# Patient Record
Sex: Female | Born: 1960 | Race: White | Hispanic: No | Marital: Married
Health system: Southern US, Community
[De-identification: ages and names within clinical notes are randomized; demographics above are authoritative.]

---

## 2001-01-23 ENCOUNTER — Ambulatory Visit (HOSPITAL_COMMUNITY): Admission: RE | Admit: 2001-01-23 | Discharge: 2001-01-23 | Payer: Self-pay | Admitting: Orthopedic Surgery

## 2004-09-08 ENCOUNTER — Encounter: Admission: RE | Admit: 2004-09-08 | Discharge: 2004-09-08 | Payer: Self-pay | Admitting: Family Medicine

## 2008-03-03 ENCOUNTER — Ambulatory Visit: Payer: Self-pay | Admitting: Family Medicine

## 2008-04-16 ENCOUNTER — Ambulatory Visit: Payer: Self-pay | Admitting: Family Medicine

## 2014-12-24 ENCOUNTER — Other Ambulatory Visit: Payer: Self-pay

## 2014-12-24 DIAGNOSIS — Z1231 Encounter for screening mammogram for malignant neoplasm of breast: Secondary | ICD-10-CM

## 2015-01-26 ENCOUNTER — Ambulatory Visit
Admission: RE | Admit: 2015-01-26 | Discharge: 2015-01-26 | Disposition: A | Discharge status: Home / Self Care | Payer: BC Managed Care – PPO | Source: Ambulatory Visit

## 2015-01-26 DIAGNOSIS — Z1231 Encounter for screening mammogram for malignant neoplasm of breast: Secondary | ICD-10-CM

## 2015-12-25 ENCOUNTER — Other Ambulatory Visit: Payer: Self-pay | Admitting: Family Medicine

## 2015-12-25 DIAGNOSIS — Z1231 Encounter for screening mammogram for malignant neoplasm of breast: Secondary | ICD-10-CM

## 2016-01-28 ENCOUNTER — Ambulatory Visit: Payer: BC Managed Care – PPO

## 2016-02-25 ENCOUNTER — Ambulatory Visit
Admission: RE | Admit: 2016-02-25 | Discharge: 2016-02-25 | Disposition: A | Discharge status: Home / Self Care | Payer: BC Managed Care – PPO | Source: Ambulatory Visit | Attending: Family Medicine | Admitting: Family Medicine

## 2016-02-25 DIAGNOSIS — Z1231 Encounter for screening mammogram for malignant neoplasm of breast: Secondary | ICD-10-CM

## 2016-02-29 ENCOUNTER — Other Ambulatory Visit: Payer: Self-pay | Admitting: Family Medicine

## 2016-02-29 DIAGNOSIS — R928 Other abnormal and inconclusive findings on diagnostic imaging of breast: Secondary | ICD-10-CM

## 2016-03-07 ENCOUNTER — Ambulatory Visit
Admission: RE | Admit: 2016-03-07 | Discharge: 2016-03-07 | Disposition: A | Discharge status: Home / Self Care | Payer: BC Managed Care – PPO | Source: Ambulatory Visit | Attending: Family Medicine | Admitting: Family Medicine

## 2016-03-07 DIAGNOSIS — R928 Other abnormal and inconclusive findings on diagnostic imaging of breast: Secondary | ICD-10-CM

## 2016-11-08 ENCOUNTER — Other Ambulatory Visit: Payer: Self-pay | Admitting: Family Medicine

## 2016-11-08 DIAGNOSIS — N63 Unspecified lump in unspecified breast: Secondary | ICD-10-CM

## 2016-11-23 ENCOUNTER — Other Ambulatory Visit: Payer: BC Managed Care – PPO

## 2016-12-01 ENCOUNTER — Ambulatory Visit
Admission: RE | Admit: 2016-12-01 | Discharge: 2016-12-01 | Disposition: A | Discharge status: Home / Self Care | Payer: BC Managed Care – PPO | Source: Ambulatory Visit | Attending: Family Medicine | Admitting: Family Medicine

## 2016-12-01 DIAGNOSIS — N63 Unspecified lump in unspecified breast: Secondary | ICD-10-CM

## 2017-01-16 ENCOUNTER — Other Ambulatory Visit: Payer: Self-pay | Admitting: Family Medicine

## 2017-01-16 DIAGNOSIS — Z1231 Encounter for screening mammogram for malignant neoplasm of breast: Secondary | ICD-10-CM

## 2017-03-02 ENCOUNTER — Ambulatory Visit
Admission: RE | Admit: 2017-03-02 | Discharge: 2017-03-02 | Disposition: A | Discharge status: Home / Self Care | Payer: BC Managed Care – PPO | Source: Ambulatory Visit | Attending: Family Medicine | Admitting: Family Medicine

## 2017-03-02 DIAGNOSIS — Z1231 Encounter for screening mammogram for malignant neoplasm of breast: Secondary | ICD-10-CM

## 2018-01-19 ENCOUNTER — Other Ambulatory Visit: Payer: Self-pay | Admitting: Family Medicine

## 2018-01-19 DIAGNOSIS — Z1231 Encounter for screening mammogram for malignant neoplasm of breast: Secondary | ICD-10-CM

## 2018-03-05 ENCOUNTER — Ambulatory Visit
Admission: RE | Admit: 2018-03-05 | Discharge: 2018-03-05 | Disposition: A | Discharge status: Home / Self Care | Payer: BC Managed Care – PPO | Source: Ambulatory Visit | Attending: Family Medicine | Admitting: Family Medicine

## 2018-03-05 DIAGNOSIS — Z1231 Encounter for screening mammogram for malignant neoplasm of breast: Secondary | ICD-10-CM

## 2019-02-25 ENCOUNTER — Other Ambulatory Visit: Payer: Self-pay | Admitting: Family Medicine

## 2019-02-25 DIAGNOSIS — Z1231 Encounter for screening mammogram for malignant neoplasm of breast: Secondary | ICD-10-CM

## 2019-03-19 ENCOUNTER — Other Ambulatory Visit: Payer: Self-pay

## 2019-03-19 ENCOUNTER — Ambulatory Visit
Admission: RE | Admit: 2019-03-19 | Discharge: 2019-03-19 | Disposition: A | Discharge status: Home / Self Care | Payer: BC Managed Care – PPO | Source: Ambulatory Visit | Attending: Family Medicine | Admitting: Family Medicine

## 2019-03-19 DIAGNOSIS — Z1231 Encounter for screening mammogram for malignant neoplasm of breast: Secondary | ICD-10-CM

## 2019-12-26 ENCOUNTER — Ambulatory Visit: Payer: BC Managed Care – PPO | Attending: Internal Medicine

## 2019-12-26 DIAGNOSIS — Z23 Encounter for immunization: Secondary | ICD-10-CM

## 2019-12-26 NOTE — Progress Notes (Signed)
   Covid-[facility] Vaccination [facility]  Name:  Emmory Solivan    MRN: [id number] DOB: 1961/03/04  12/26/2019  Ms. Lapinsky was observed post Covid-19 immunization for 15 minutes without incident. She was provided with Vaccine Information Sheet and instruction to access the V-Safe system.   Ms. Bloch was instructed to call 911 with any severe reactions post vaccine: Marland Kitchen Difficulty breathing  . Swelling of face and throat  . A fast heartbeat  . A bad rash all over body  . Dizziness and weakness

## 2020-02-11 ENCOUNTER — Other Ambulatory Visit: Payer: Self-pay | Admitting: Family Medicine

## 2020-02-11 DIAGNOSIS — Z1231 Encounter for screening mammogram for malignant neoplasm of breast: Secondary | ICD-10-CM

## 2020-03-23 ENCOUNTER — Ambulatory Visit
Admission: RE | Admit: 2020-03-23 | Discharge: 2020-03-23 | Disposition: A | Discharge status: Home / Self Care | Payer: BC Managed Care – PPO | Source: Ambulatory Visit | Attending: Family Medicine | Admitting: Family Medicine

## 2020-03-23 ENCOUNTER — Other Ambulatory Visit: Payer: Self-pay

## 2020-03-23 DIAGNOSIS — Z1231 Encounter for screening mammogram for malignant neoplasm of breast: Secondary | ICD-10-CM

## 2020-03-25 ENCOUNTER — Ambulatory Visit: Payer: BC Managed Care – PPO

## 2020-07-01 ENCOUNTER — Ambulatory Visit: Payer: Self-pay | Attending: Internal Medicine

## 2020-07-01 ENCOUNTER — Other Ambulatory Visit: Payer: Self-pay

## 2020-07-01 DIAGNOSIS — Z23 Encounter for immunization: Secondary | ICD-10-CM

## 2020-07-01 NOTE — Progress Notes (Signed)
   Covid-19 Vaccination Clinic  Name:  Leah Wilcox    MRN: [id number] DOB: 03/09/1961  07/01/2020  Leah Wilcox was observed post Covid-19 immunization for 15 minutes without incident. She was provided with Vaccine Information Sheet and instruction to access the V-Safe system.   Leah Wilcox was instructed to call 911 with any severe reactions post vaccine: Marland Kitchen Difficulty breathing  . Swelling of face and throat  . A fast heartbeat  . A bad rash all over body  . Dizziness and weakness   Immunizations Administered    Name Date Dose VIS Date Route   PFIZER Comrnaty(Gray TOP) Covid-19 Vaccine 07/01/2020 12:20 PM 0.3 mL 03/05/2020 Intramuscular   Manufacturer: [employer], [employer]   Lot: [id number]   NDC: [phone]

## 2020-07-09 ENCOUNTER — Other Ambulatory Visit (HOSPITAL_BASED_OUTPATIENT_CLINIC_OR_DEPARTMENT_OTHER): Payer: Self-pay

## 2020-07-09 MED ORDER — COVID-19 MRNA VACCINE (PFIZER) 30 MCG/0.3ML IM SUSP
INTRAMUSCULAR | 0 refills | Status: AC
  Filled 2020-07-09: qty 0.3, 1d supply, fill #0

## 2020-12-02 ENCOUNTER — Ambulatory Visit: Payer: Self-pay

## 2021-02-22 ENCOUNTER — Other Ambulatory Visit: Payer: Self-pay | Admitting: Family Medicine

## 2021-02-22 DIAGNOSIS — Z1231 Encounter for screening mammogram for malignant neoplasm of breast: Secondary | ICD-10-CM

## 2021-03-26 ENCOUNTER — Ambulatory Visit
Admission: RE | Admit: 2021-03-26 | Discharge: 2021-03-26 | Disposition: A | Discharge status: Home / Self Care | Payer: BC Managed Care – PPO | Source: Ambulatory Visit | Attending: Family Medicine | Admitting: Family Medicine

## 2021-03-26 DIAGNOSIS — Z1231 Encounter for screening mammogram for malignant neoplasm of breast: Secondary | ICD-10-CM

## 2022-02-10 ENCOUNTER — Other Ambulatory Visit: Payer: Self-pay | Admitting: Family Medicine

## 2022-02-10 DIAGNOSIS — Z1231 Encounter for screening mammogram for malignant neoplasm of breast: Secondary | ICD-10-CM

## 2022-04-18 ENCOUNTER — Ambulatory Visit
Admission: RE | Admit: 2022-04-18 | Discharge: 2022-04-18 | Disposition: A | Discharge status: Home / Self Care | Payer: BC Managed Care – PPO | Source: Ambulatory Visit | Attending: Family Medicine | Admitting: Family Medicine

## 2022-04-18 DIAGNOSIS — Z1231 Encounter for screening mammogram for malignant neoplasm of breast: Secondary | ICD-10-CM

## 2022-04-26 IMAGING — MG MM DIGITAL SCREENING BILAT W/ TOMO AND CAD
8 series · 8 of 24 positions shown · non-contrast
Comparison: Previous exam(s).

CLINICAL DATA: Screening.

EXAM:
DIGITAL SCREENING BILATERAL MAMMOGRAM WITH TOMOSYNTHESIS AND CAD
TECHNIQUE: Bilateral screening digital craniocaudal and mediolateral oblique
mammograms were obtained. Bilateral screening digital breast
tomosynthesis was performed. The images were evaluated with
computer-aided detection.

[R MLO synth-2D]
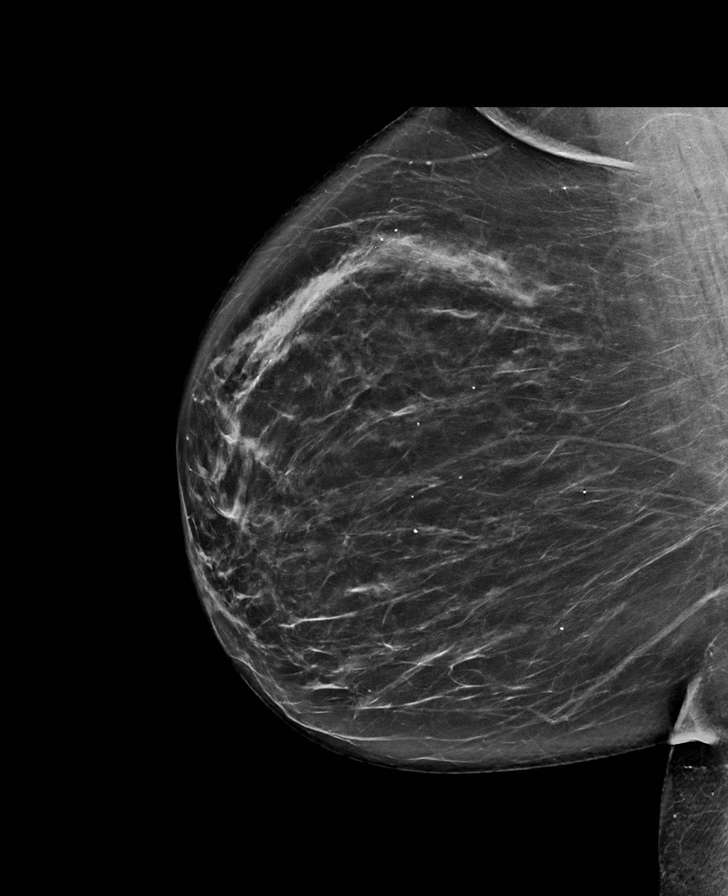

[L CC synth-2D]
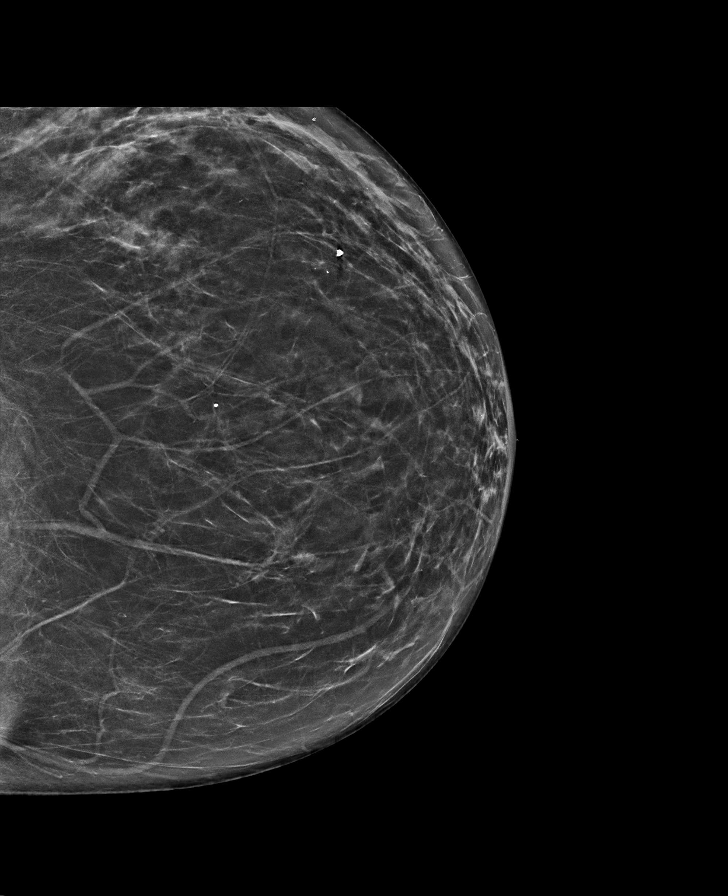

[L MLO synth-2D]
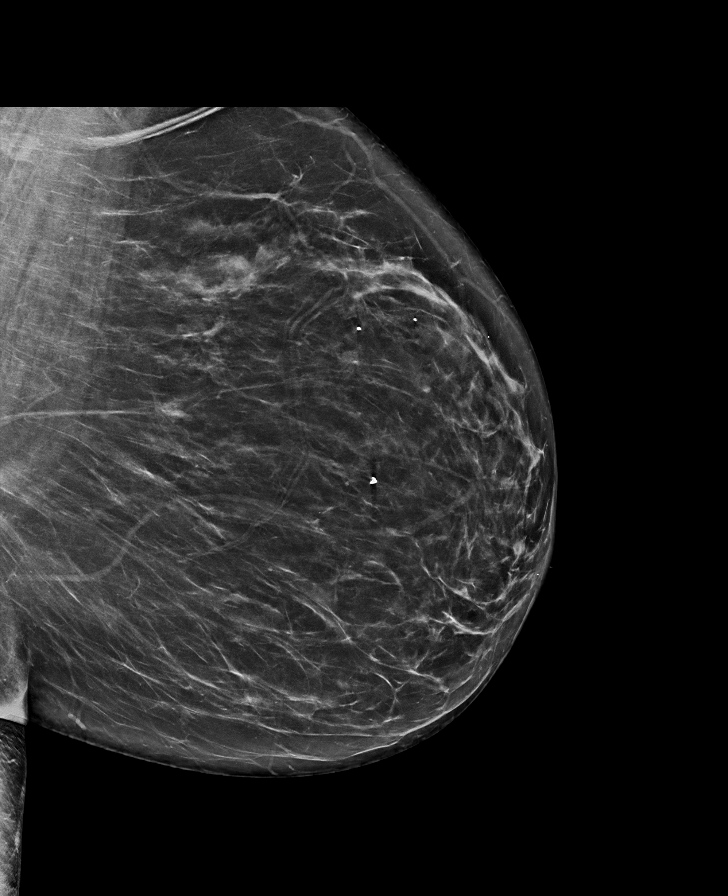

[R CC synth-2D]
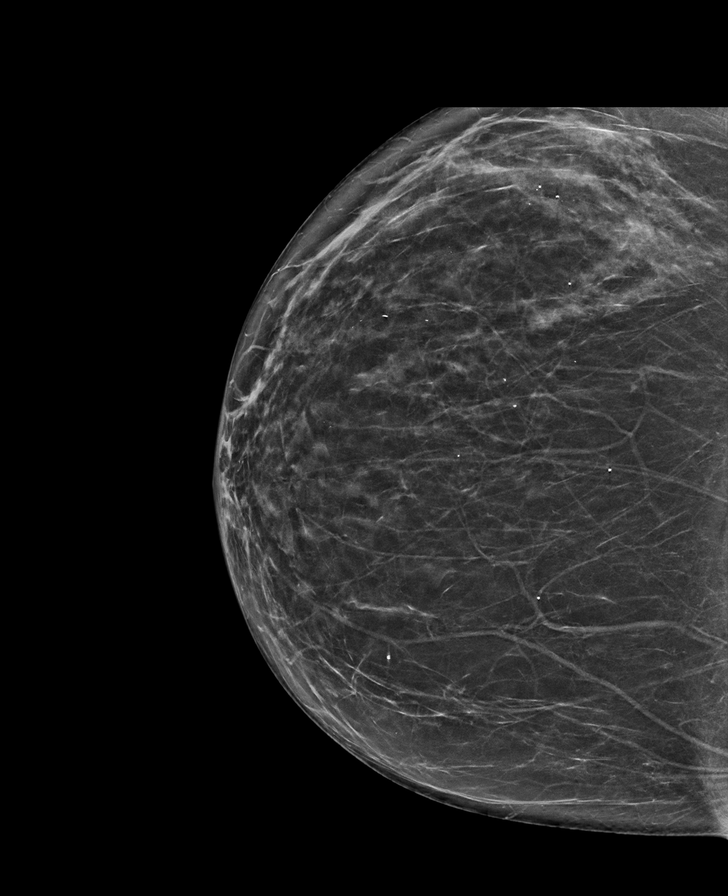

[R MLO tomo · tomo slice 44/87.0]
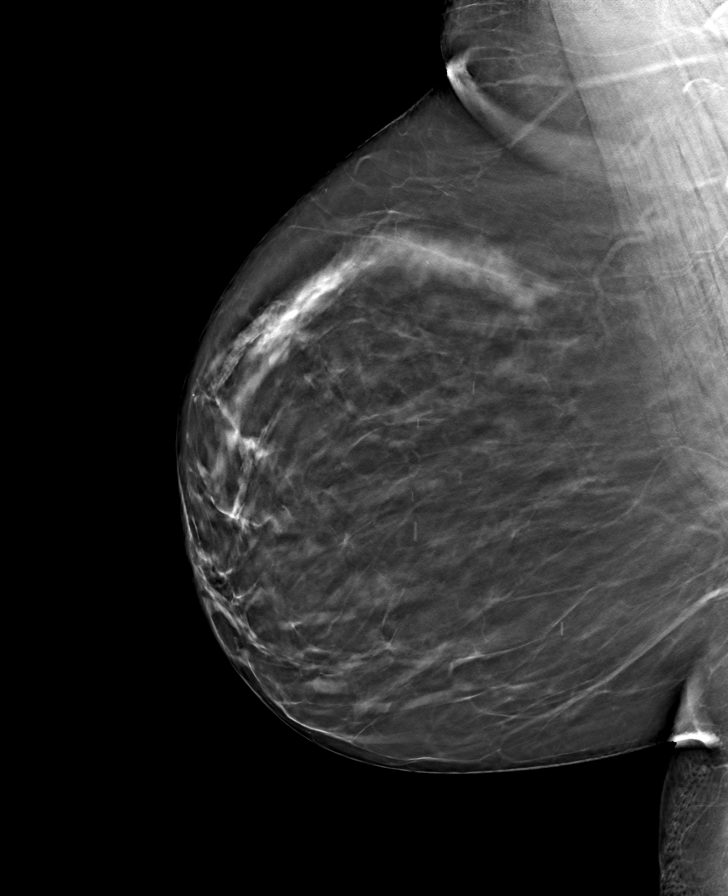

[R CC tomo · tomo slice 36/71.0]
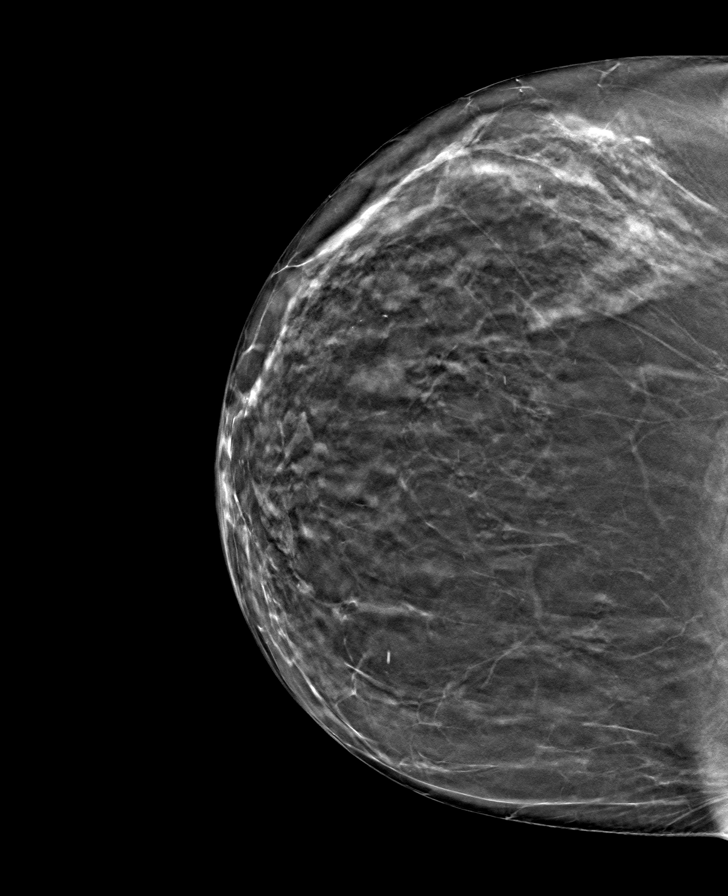

[L MLO tomo · tomo slice 41/80.0]
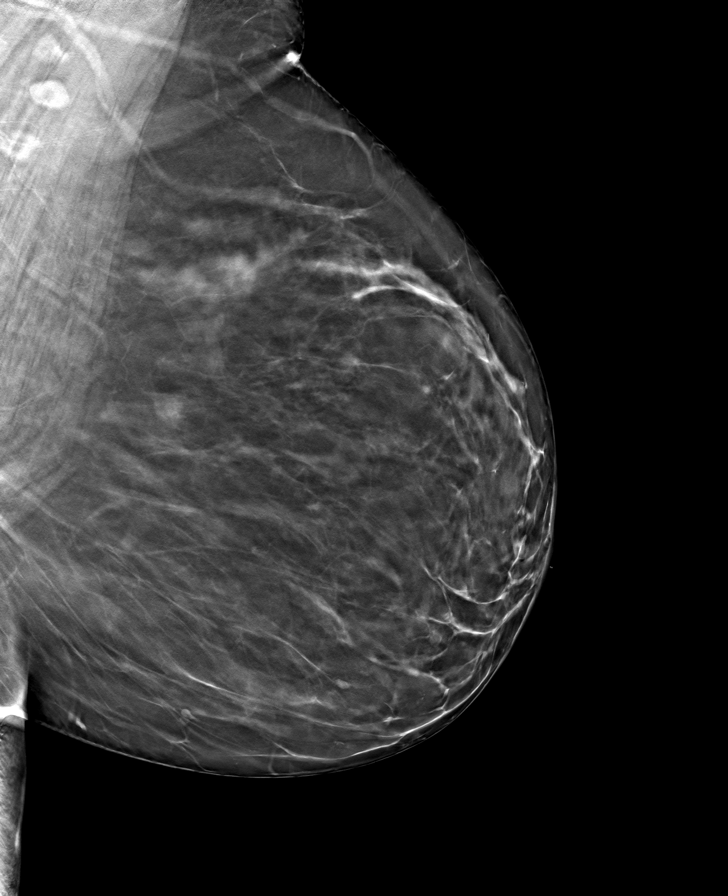

[L CC tomo · tomo slice 35/69.0]
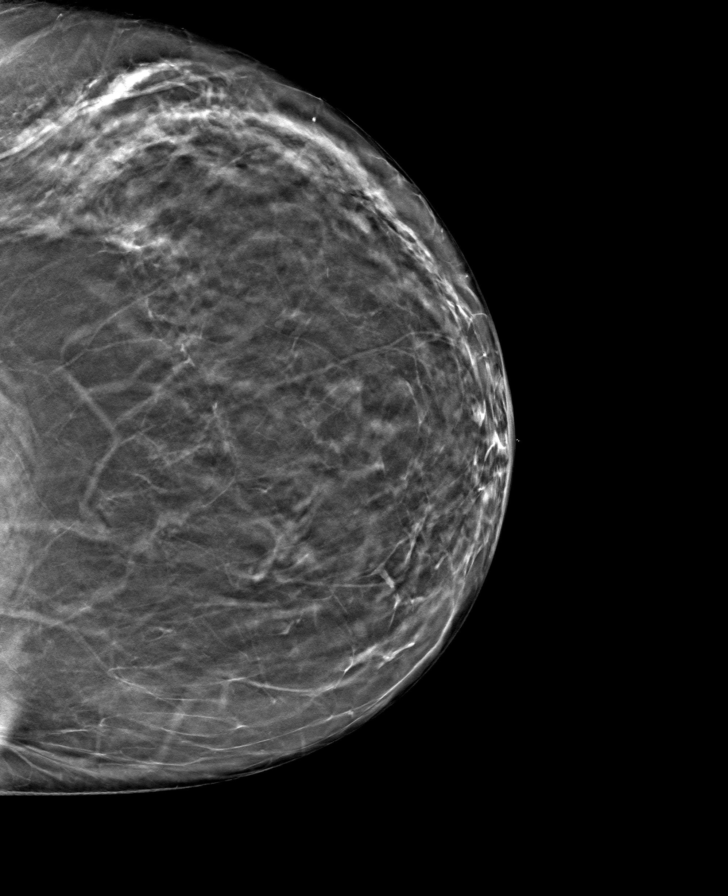

[8 of 24 positions shown; findings below may reference images not displayed]

ACR Breast Density Category b: There are scattered areas of
fibroglandular density.
FINDINGS: There are no findings suspicious for malignancy.
IMPRESSION: No mammographic evidence of malignancy. A result letter of this
screening mammogram will be mailed directly to the patient.

RECOMMENDATION:
Screening mammogram in one year. (Code:51-O-LD2)

BI-RADS CATEGORY  1: Negative.

## 2023-03-07 ENCOUNTER — Other Ambulatory Visit: Payer: Self-pay | Admitting: Family Medicine

## 2023-03-07 DIAGNOSIS — Z1231 Encounter for screening mammogram for malignant neoplasm of breast: Secondary | ICD-10-CM

## 2023-04-24 ENCOUNTER — Ambulatory Visit
Admission: RE | Admit: 2023-04-24 | Discharge: 2023-04-24 | Disposition: A | Discharge status: Home / Self Care | Payer: 59 | Source: Ambulatory Visit | Attending: Family Medicine

## 2023-04-24 DIAGNOSIS — Z1231 Encounter for screening mammogram for malignant neoplasm of breast: Secondary | ICD-10-CM

## 2024-03-12 ENCOUNTER — Other Ambulatory Visit: Payer: Self-pay | Admitting: Family Medicine

## 2024-03-12 DIAGNOSIS — Z1231 Encounter for screening mammogram for malignant neoplasm of breast: Secondary | ICD-10-CM

## 2024-04-07 ENCOUNTER — Other Ambulatory Visit (HOSPITAL_BASED_OUTPATIENT_CLINIC_OR_DEPARTMENT_OTHER): Payer: Self-pay | Admitting: Family Medicine

## 2024-04-07 DIAGNOSIS — E2839 Other primary ovarian failure: Secondary | ICD-10-CM

## 2024-04-26 ENCOUNTER — Ambulatory Visit
Admission: RE | Admit: 2024-04-26 | Discharge: 2024-04-26 | Disposition: A | Source: Ambulatory Visit | Attending: Family Medicine

## 2024-04-26 DIAGNOSIS — Z1231 Encounter for screening mammogram for malignant neoplasm of breast: Secondary | ICD-10-CM
